# Patient Record
Sex: Male | Born: 1980 | Hispanic: No | Marital: Married | Smoking: Never smoker
Health system: Southern US, Community
[De-identification: ages and names within clinical notes are randomized; demographics above are authoritative.]

## PROBLEM LIST (undated history)

## (undated) DIAGNOSIS — F909 Attention-deficit hyperactivity disorder, unspecified type: Secondary | ICD-10-CM

## (undated) DIAGNOSIS — E785 Hyperlipidemia, unspecified: Secondary | ICD-10-CM

## (undated) HISTORY — PX: ARTHROSCOPIC REPAIR ACL: SUR80

---

## 2017-04-19 ENCOUNTER — Ambulatory Visit
Admission: RE | Admit: 2017-04-19 | Discharge: 2017-04-19 | Disposition: A | Discharge status: Home / Self Care | Payer: PRIVATE HEALTH INSURANCE | Source: Ambulatory Visit | Attending: Family Medicine | Admitting: Family Medicine

## 2017-04-19 ENCOUNTER — Other Ambulatory Visit: Payer: Self-pay | Admitting: Family Medicine

## 2017-04-19 DIAGNOSIS — M5489 Other dorsalgia: Secondary | ICD-10-CM

## 2017-11-05 IMAGING — CR DG LUMBAR SPINE 2-3V
3 series · 3 of 3 positions shown · non-contrast
Comparison: None.

CLINICAL DATA: Bilateral low back pain for 1-2 years which radiates
in the left leg.

EXAM:
LUMBAR SPINE - 2-3 VIEW

[t l-spine a.p.]
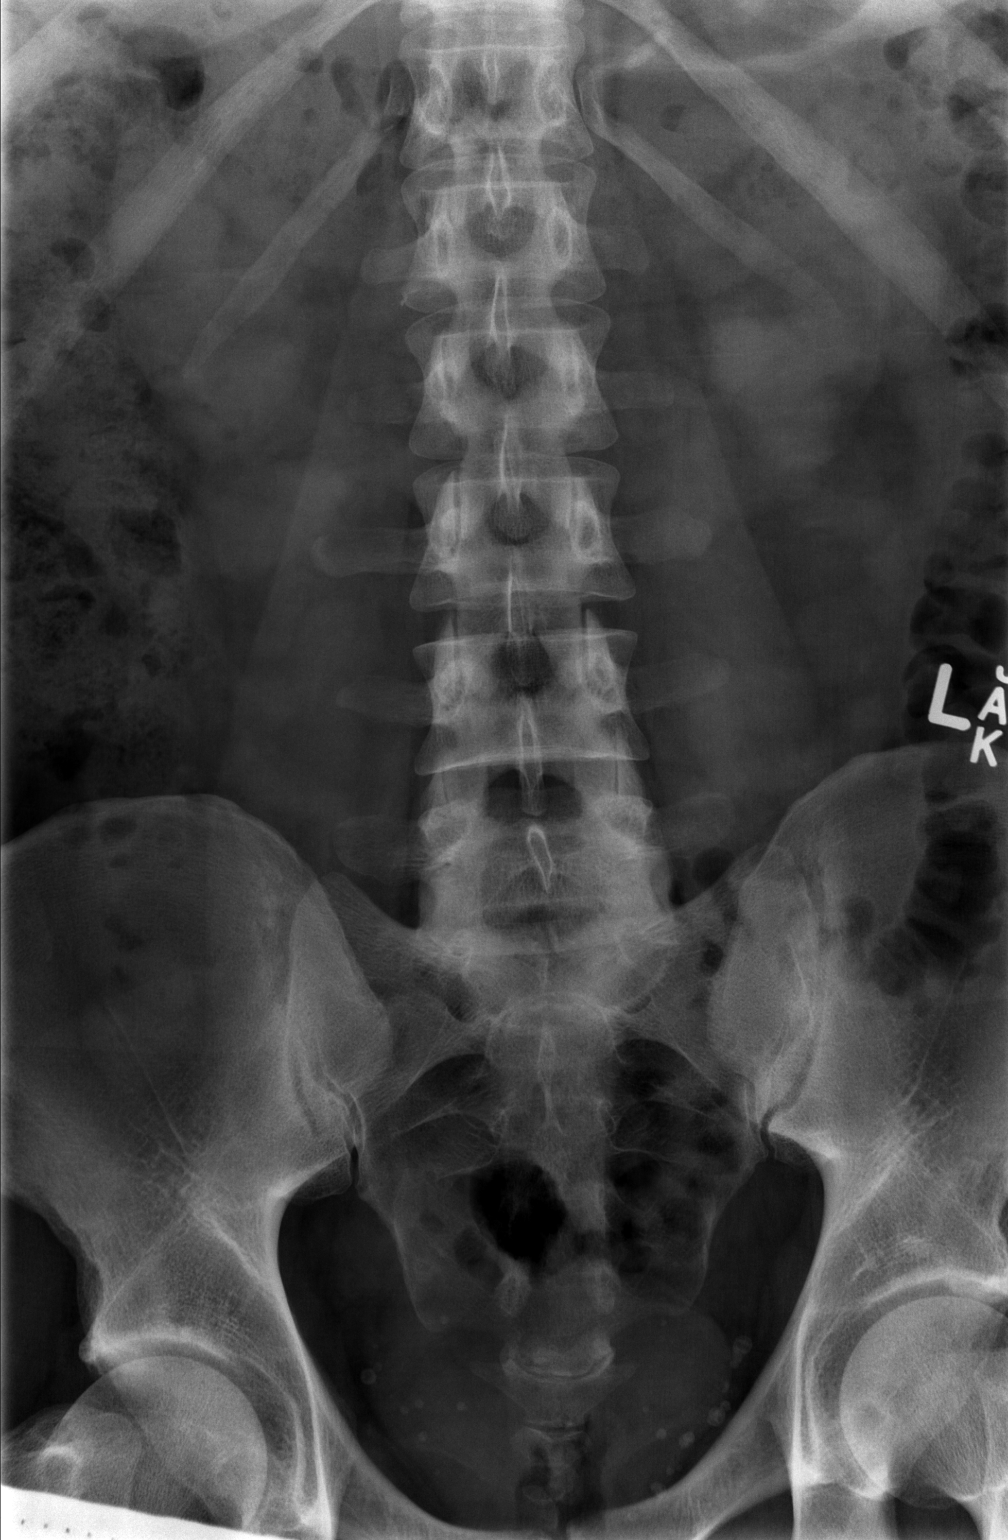

[t l-spine lat]
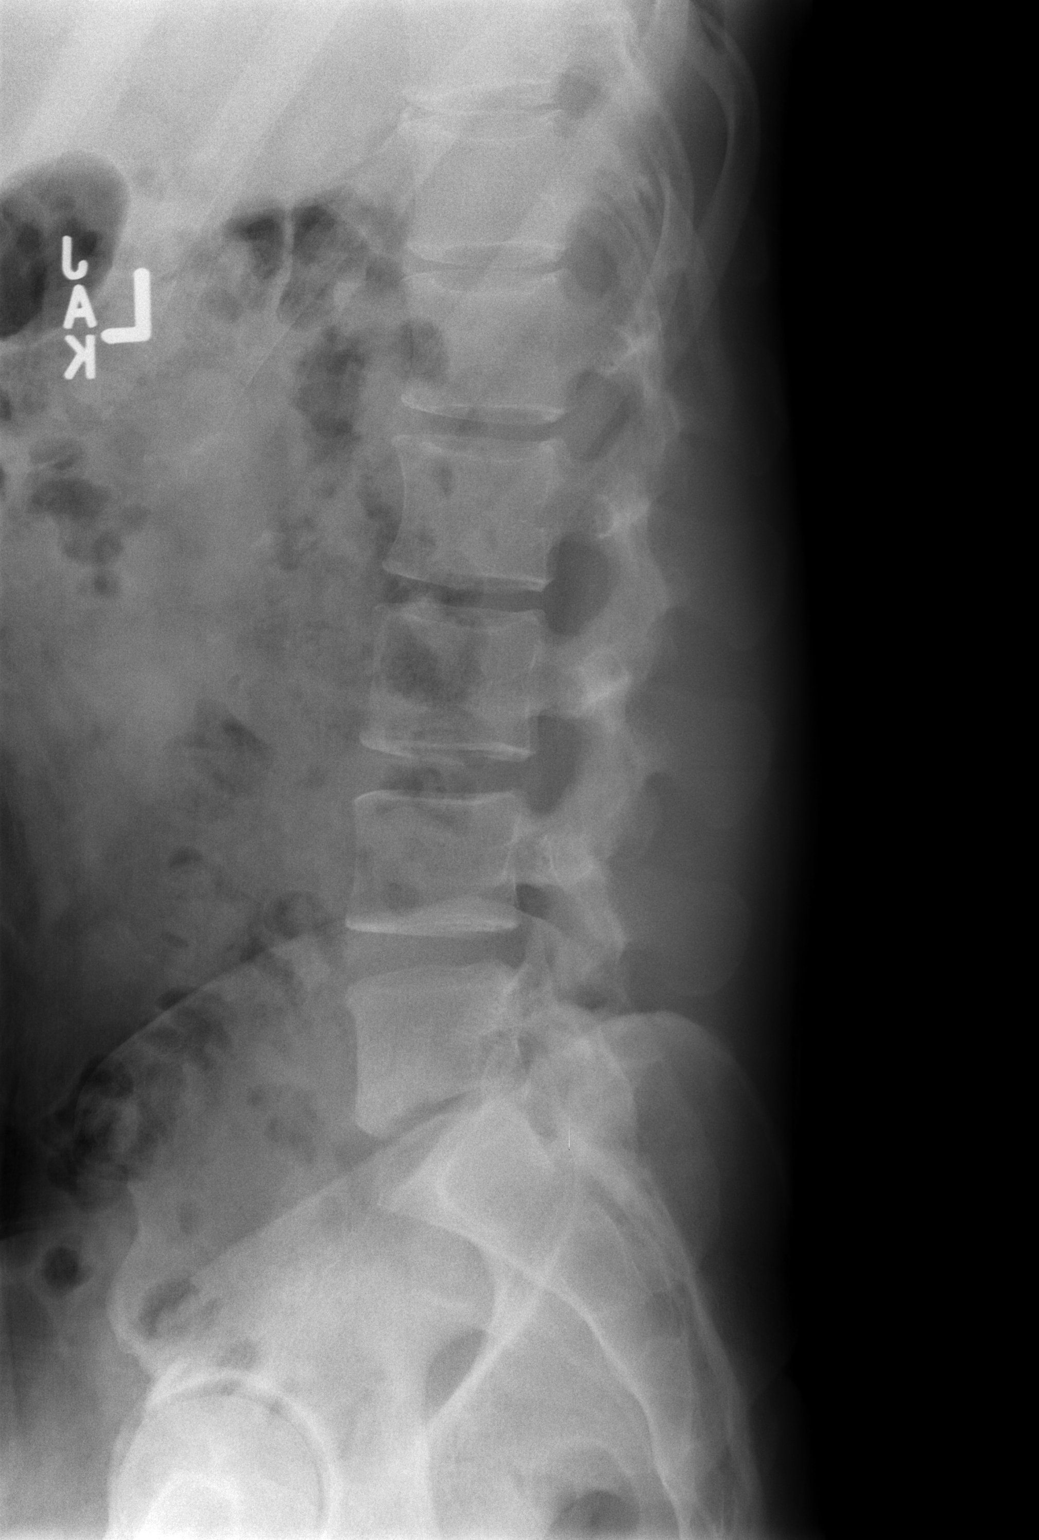

[t l-spine l5-s1 spot]
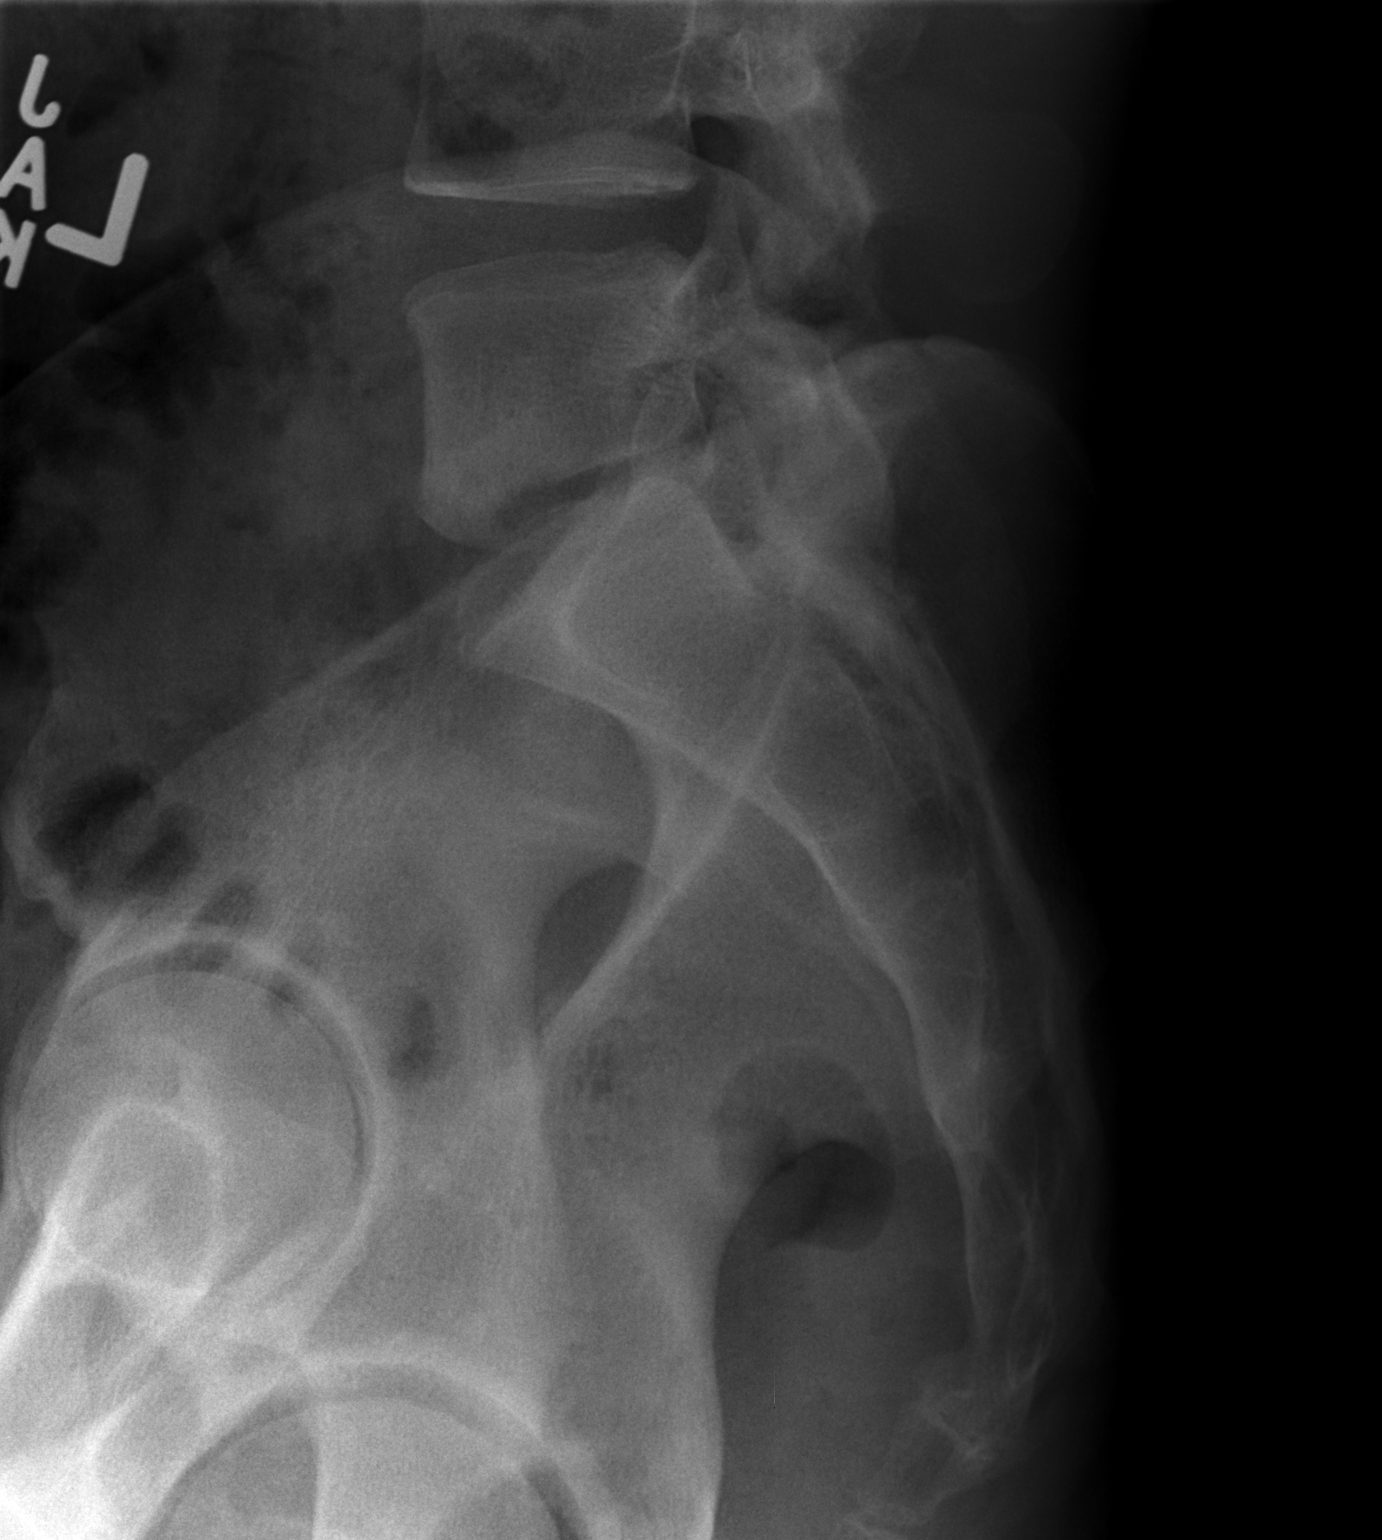

[3 of 3 positions shown; findings below may reference images not displayed]

FINDINGS: There 5 non rib-bearing lumbar type vertebrae. There is
straightening of the normal lumbar lordosis with grade 1
retrolisthesis of L5 on S1. Moderate disc space narrowing is present
at L5-S1. Intervertebral disc space heights are preserved elsewhere.
Lumbar vertebral body heights are preserved, and no fracture is
identified. Numerous small calcifications in the pelvis likely
represent phleboliths.
IMPRESSION: L5-S1 disc degeneration with grade 1 retrolisthesis.

## 2020-04-07 DIAGNOSIS — E785 Hyperlipidemia, unspecified: Secondary | ICD-10-CM | POA: Insufficient documentation

## 2020-06-21 DIAGNOSIS — R5383 Other fatigue: Secondary | ICD-10-CM | POA: Insufficient documentation

## 2021-07-28 DIAGNOSIS — F900 Attention-deficit hyperactivity disorder, predominantly inattentive type: Secondary | ICD-10-CM | POA: Insufficient documentation

## 2022-02-02 ENCOUNTER — Encounter: Payer: Self-pay | Admitting: Primary Care

## 2022-02-02 ENCOUNTER — Ambulatory Visit (INDEPENDENT_AMBULATORY_CARE_PROVIDER_SITE_OTHER): Payer: BC Managed Care – PPO | Admitting: Primary Care

## 2022-02-02 VITALS — BP 118/72 | HR 71 | Temp 98.5°F | Ht 73.0 in | Wt 235.0 lb

## 2022-02-02 DIAGNOSIS — R400 Somnolence: Secondary | ICD-10-CM | POA: Diagnosis not present

## 2022-02-02 DIAGNOSIS — R7989 Other specified abnormal findings of blood chemistry: Secondary | ICD-10-CM | POA: Insufficient documentation

## 2022-02-02 DIAGNOSIS — R0683 Snoring: Secondary | ICD-10-CM | POA: Diagnosis not present

## 2022-02-02 DIAGNOSIS — G4700 Insomnia, unspecified: Secondary | ICD-10-CM | POA: Diagnosis not present

## 2022-02-02 DIAGNOSIS — J310 Chronic rhinitis: Secondary | ICD-10-CM | POA: Diagnosis not present

## 2022-02-02 NOTE — Assessment & Plan Note (Signed)
-   Patient has symptoms of loud snoring, restless sleep, gasping for air while sleeping and daytime fatigue.  Epworth 6. BMI 31. Concern patient could have obstructive sleep apnea, needs home sleep study to evaluate. Discussed risk of untreated sleep apnea including cardiac arrhythmias, pulm HTN, stroke, DM. We briefly reviewed treatment options. Encouraged patient to work on weight loss efforts and focus on side sleeping position/elevate head of bed. Advised against driving if experiencing excessive daytime sleepiness. Follow-up in 4-6 weeks to review sleep study results and discuss treatment options further. ? ?

## 2022-02-02 NOTE — Patient Instructions (Addendum)
Sleep apnea is defined as period of 10 seconds or longer when you stop breathing at night. This can happen multiple times a night. Dx sleep apnea is when this occurs more than 5 times an hour.  ?  ?Mild OSA 5-15 apneic events an hour ?Moderate OSA 15-30 apneic events an hour ?Severe OSA > 30 apneic events an hour ?  ?Untreated sleep apnea puts you at higher risk for cardiac arrhythmias, pulmonary HTN, stroke and diabetes ?  ?Treatment options include weight loss, side sleeping position, oral appliance, CPAP therapy or referral to ENT for possible surgical options  ?  ?Recommendations: ?Focus on side sleeping position ?Work on weight loss efforts  ?Do not drive if experiencing excessive daytime sleepiness of fatigue  ?  ?Orders: ?Home sleep study re: snoring  ?  ?Follow-up: ?Call after home sleep test to set up virtual visit with [facility] NP to review sleep study results and discuss treatment options further ? ?

## 2022-02-02 NOTE — Assessment & Plan Note (Signed)
-   Following with PCP/ currently being worked up  ?

## 2022-02-02 NOTE — Assessment & Plan Note (Signed)
-   Patient has difficulty initiating and staying asleep. Tried and failed melatonin. He is not interested in prescription sleep aid at this time.  Recommend diphenhydramine 25-50mg  at bedtime.  ?

## 2022-02-02 NOTE — Assessment & Plan Note (Signed)
-   Continue prn Zyrtec and flonase ?- Consider ENT eval after sleep study to assess nasal deviation  ?

## 2022-02-02 NOTE — Progress Notes (Signed)
? ?_0  ID: Barbara Cower, male    DOB: Oct 09, 1981, 41 y.o.   MRN: [id number] ? ?Chief Complaint  ?Patient presents with  ? Consult  ?  He has trouble staying and going to sleep, he feels that he snores due to 10 lb weight gain, he feels tired during the day.   ? ? ?Referring provider: ?Lazoff, Shawn P, DO ? ?HPI: ?41 year old male. PMH significant for ADHD, dyslipidemia and fatigue.  ? ?02/02/2022 ?Patient presents today for sleep consult. He has symptoms of snoring, restless sleep and fatigue. He has had symptoms for several years. He is married and has 3 month and 29.40 year old at home. His children sleep well through the night. His wife has told him he gasp for air while sleeping. He has no energy to work out. His testonseter levels have been low. He takes Vyvanse 35m in the morning and prn Adderal 512min the afternoon as needed. He has trouble falling asleep at night at times. He has tried melatonin, higher dose causes some grogginess but low dose does not work. He has not tried diphenhydramine. He does not want to take prescription sleep aid d/t having children at home. He has chronic sinus congestion and will take zyrtec, flonase and sudafed PE as needed. His goal weight is 210lbs. No symptoms of narcolepsy, cataplexy or sleep walking.  ? ?Sleep questionnaire ?Symptoms- Snoring, restless sleep and daytime sleepiness    ?Prior sleep study- None ?Bedtime- 9:30-11:30pm ?Time to fall asleep- 45-6037m ?Nocturnal awakenings- 2-3 times  ?Out of bed/start of day- 5:30-6:30am ?Weight changes- up 10 lbs  ?Do you operate heavy machinery- No ?Do you currently wear CPAP- No ?Do you current wear oxygen- No ?Epworth- 6 ? ?No Known Allergies ? ?Immunization History  ?Administered Date(s) Administered  ? DTP 08/05/1982, 09/22/1982, 12/17/1982, 05/15/1984, 06/08/1987  ? Influenza,inj,Quad PF,6+ Mos 08/24/2021  ? Influenza-Unspecified 09/30/2020  ? MMR 04/09/1984, 04/10/1987, 04/01/1996  ? Moderna Sars-Covid-2 Vaccination  01/06/2020, 02/03/2020  ? OPV 08/05/1982, 09/22/1982, 05/15/1984, 06/08/1987  ? Td 07/29/1998  ? Tdap 05/26/2019  ? ? ?No past medical history on file. ? ?Tobacco History: ?Social History  ? ?Tobacco Use  ?Smoking Status Never  ? Passive exposure: Never  ?Smokeless Tobacco Never  ? ?Counseling given: Not Answered ? ? ?Outpatient Medications Prior to Visit  ?Medication Sig Dispense Refill  ? amphetamine-dextroamphetamine (ADDERALL) 5 MG tablet Take 1 tablet by mouth daily as needed.    ? cholecalciferol (VITAMIN D3) 25 MCG (1000 UNIT) tablet Take 1,000 Units by mouth daily.    ? Diclofenac 35 MG CAPS     ? Multiple Vitamins-Minerals (MULTIVITAMIN WITH MINERALS) tablet Take 1 tablet by mouth daily.    ? pantoprazole (PROTONIX) 40 MG tablet Take 40 mg by mouth daily.    ? Probiotic Product (PROBIOTIC BLEND PO) Take 1 capsule by mouth.    ? VYVANSE 60 MG capsule Take 60 mg by mouth every morning.    ? ?No facility-administered medications prior to visit.  ? ?Review of Systems ? ?Review of Systems  ?Constitutional:  Positive for fatigue.  ?HENT:  Positive for congestion and rhinorrhea.   ?Respiratory: Negative.    ?Cardiovascular: Negative.   ?Psychiatric/Behavioral:  Positive for sleep disturbance.   ? ? ?Physical Exam ? ?BP 118/72 (BP Location: Left Arm, Patient Position: Sitting, Cuff Size: Normal)   Pulse 71   Temp 98.5 ?F (36.9 ?C) (Oral)   Ht _1  (1.854 m)   Wt 235 lb (106.6 kg)  SpO2 96%   BMI 31.00 kg/m?  ?Physical Exam ?Constitutional:   ?   Appearance: Normal appearance.  ?HENT:  ?   Head: Normocephalic and atraumatic.  ?   Nose:  ?   Comments: Nasal deviation to the right  ?   Mouth/Throat:  ?   Mouth: Mucous membranes are moist.  ?   Pharynx: Oropharynx is clear.  ?Cardiovascular:  ?   Rate and Rhythm: Normal rate and regular rhythm.  ?Pulmonary:  ?   Effort: Pulmonary effort is normal.  ?   Breath sounds: Normal breath sounds.  ?Musculoskeletal:     ?   General: Normal range of motion.  ?   Cervical  back: Normal range of motion and neck supple.  ?Skin: ?   General: Skin is warm and dry.  ?Neurological:  ?   General: No focal deficit present.  ?   Mental Status: He is alert and oriented to person, place, and time. Mental status is at baseline.  ?Psychiatric:     ?   Mood and Affect: Mood normal.     ?   Behavior: Behavior normal.     ?   Thought Content: Thought content normal.     ?   Judgment: Judgment normal.  ?  ? ?Lab Results: ? ?CBC ?No results found for: WBC, RBC, HGB, HCT, PLT, MCV, MCH, MCHC, RDW, LYMPHSABS, MONOABS, EOSABS, BASOSABS ? ?BMET ?No results found for: NA, K, CL, CO2, GLUCOSE, BUN, CREATININE, CALCIUM, GFRNONAA, GFRAA ? ?BNP ?No results found for: BNP ? ?ProBNP ?No results found for: PROBNP ? ?Imaging: ?No results found. ? ? ?Assessment & Plan:  ? ?Loud snoring ?- Patient has symptoms of loud snoring, restless sleep, gasping for air while sleeping and daytime fatigue.  Epworth 6. BMI 31. Concern patient could have obstructive sleep apnea, needs home sleep study to evaluate. Discussed risk of untreated sleep apnea including cardiac arrhythmias, pulm HTN, stroke, DM. We briefly reviewed treatment options. Encouraged patient to work on weight loss efforts and focus on side sleeping position/elevate head of bed. Advised against driving if experiencing excessive daytime sleepiness. Follow-up in 4-6 weeks to review sleep study results and discuss treatment options further. ? ? ?Chronic rhinitis ?- Continue prn Zyrtec and flonase ?- Consider ENT eval after sleep study to assess nasal deviation  ? ?Insomnia ?- Patient has difficulty initiating and staying asleep. Tried and failed melatonin. He is not interested in prescription sleep aid at this time.  Recommend diphenhydramine 25-7m at bedtime.  ? ?Low testosterone ?- Following with PCP/ currently being worked up  ? ? ?EMartyn Ehrich NP ?02/02/2022 ? ?

## 2022-03-16 ENCOUNTER — Ambulatory Visit: Payer: BC Managed Care – PPO

## 2022-03-16 DIAGNOSIS — R0683 Snoring: Secondary | ICD-10-CM

## 2022-03-16 DIAGNOSIS — R400 Somnolence: Secondary | ICD-10-CM

## 2022-03-21 ENCOUNTER — Ambulatory Visit: Payer: BC Managed Care – PPO

## 2022-03-21 DIAGNOSIS — R0683 Snoring: Secondary | ICD-10-CM

## 2022-03-30 DIAGNOSIS — R0683 Snoring: Secondary | ICD-10-CM | POA: Diagnosis not present

## 2022-04-05 ENCOUNTER — Telehealth: Payer: Self-pay | Admitting: Primary Care

## 2022-04-05 NOTE — Telephone Encounter (Signed)
Donald Nixon patient is wanting the results of sleep study. He refused office visit until he knows the results. Sleep study was done last month. Please advise

## 2022-04-06 NOTE — Telephone Encounter (Signed)
HST on 03/22/22 >> He had a total of 6 apneas and 1 hypopnea. His AHI score was 1.8/hr. SpO2 low 92% with average 96%. Percentage of snoring was 55%/ This is a normal study and does not meet diagnostic criteria of sleep apnea. He does not need CPAP. We can refer to orthodontics for oral appliance to treat primary snoring.

## 2022-04-06 NOTE — Telephone Encounter (Signed)
Attempted to call pt but unable to reach. Left message for him to return call. Routing back to triage as it originated in triage.

## 2022-04-06 NOTE — Telephone Encounter (Signed)
Called and spoke to pt. Informed him of the results and recs per [facility]. Pt verbalized understanding and states he would like to wait on the orthodontics referral and would like to focus on weight loss. Pt states he will call back if he wants the referral. Will forward to [facility] as FYI.

## 2022-06-10 ENCOUNTER — Other Ambulatory Visit (HOSPITAL_BASED_OUTPATIENT_CLINIC_OR_DEPARTMENT_OTHER): Payer: Self-pay

## 2022-06-10 MED ORDER — VYVANSE 60 MG PO CAPS
ORAL_CAPSULE | ORAL | 0 refills | Status: DC
  Filled 2022-06-10: qty 30, 30d supply, fill #0

## 2022-07-07 ENCOUNTER — Other Ambulatory Visit (HOSPITAL_BASED_OUTPATIENT_CLINIC_OR_DEPARTMENT_OTHER): Payer: Self-pay

## 2022-07-07 MED ORDER — LISDEXAMFETAMINE DIMESYLATE 60 MG PO CAPS
60.0000 mg | ORAL_CAPSULE | Freq: Every morning | ORAL | 0 refills | Status: DC
  Filled 2022-07-07: qty 30, 30d supply, fill #0

## 2022-07-13 ENCOUNTER — Other Ambulatory Visit (HOSPITAL_BASED_OUTPATIENT_CLINIC_OR_DEPARTMENT_OTHER): Payer: Self-pay

## 2022-07-13 ENCOUNTER — Ambulatory Visit: Payer: BC Managed Care – PPO | Admitting: Primary Care

## 2022-07-13 ENCOUNTER — Encounter: Payer: Self-pay | Admitting: Primary Care

## 2022-07-13 DIAGNOSIS — R5383 Other fatigue: Secondary | ICD-10-CM

## 2022-07-13 DIAGNOSIS — R0683 Snoring: Secondary | ICD-10-CM | POA: Diagnosis not present

## 2022-07-13 DIAGNOSIS — G4700 Insomnia, unspecified: Secondary | ICD-10-CM | POA: Diagnosis not present

## 2022-07-13 DIAGNOSIS — R7989 Other specified abnormal findings of blood chemistry: Secondary | ICD-10-CM

## 2022-07-13 MED ORDER — HYDROXYZINE HCL 25 MG PO TABS
25.0000 mg | ORAL_TABLET | Freq: Every evening | ORAL | 0 refills | Status: AC
  Filled 2022-07-13: qty 45, 22d supply, fill #0

## 2022-07-13 NOTE — Progress Notes (Signed)
_0  ID: Donald Nixon, male    DOB: 06/14/1981, 41 y.o.   MRN: [id number]  Chief Complaint  Patient presents with   Follow-up    Referring provider: Percell Belt, DO  HPI: 41 year old male. PMH significant for ADHD, dyslipidemia and fatigue.   Previous [facility] pulmonary encounter:  02/02/2022 Patient presents today for sleep consult. He has symptoms of snoring, restless sleep and fatigue. He has had symptoms for several years. He is married and has 70 month and 70.40 year old at home. His children sleep well through the night. His wife has told him he gasp for air while sleeping. He has no energy to work out. His testosterone levels have been low. He takes Vyvanse 13m in the morning and prn Adderall 597min the afternoon as needed. He has trouble falling asleep at night at times. He has tried melatonin, higher dose causes some grogginess but low dose does not work. He has not tried diphenhydramine. He does not want to take prescription sleep aid d/t having children at home. He has chronic sinus congestion and will take zyrtec, flonase and sudafed PE as needed. His goal weight is 210lbs. No symptoms of narcolepsy, cataplexy or sleep walking.   Sleep questionnaire Symptoms- Snoring, restless sleep and daytime sleepiness    Prior sleep study- None Bedtime- 9:30-11:30pm Time to fall asleep- 45-6033m Nocturnal awakenings- 2-3 times  Out of bed/start of day- 5:30-6:30am Weight changes- up 10 lbs  Do you operate heavy machinery- No Do you currently wear CPAP- No Do you current wear oxygen- No Epworth- 6   07/13/2022- Interim hx  Patient reports that he continues to have symptoms concerning for sleep apnea. His wife has noticed more snoring and witnessed several apneas within the first hour of him falling asleep. She has also seen him gasping for air and choke while sleeping. He had a home sleep test on 03/22/22 that showed no evidence of OSA, he had a total of 6 apneas and 1 hypopnea. His  AHI score was 1.8/hr. SpO2 low 92% with average 96%. Percentage of snoring was 55%. He continues to struggle with severe daytime sleepiness and fatigue. He depends on Vyvanse to make it through the day. He reports weight gain. He has low testosterone and vit D levels.  No Known Allergies  Immunization History  Administered Date(s) Administered   DTP 08/05/1982, 09/22/1982, 12/17/1982, 05/15/1984, 06/08/1987   Influenza,inj,Quad PF,6+ Mos 08/24/2021, 07/06/2022   Influenza-Unspecified 09/30/2020, 07/06/2022   MMR 04/09/1984, 04/10/1987, 04/01/1996   Moderna Sars-Covid-2 Vaccination 01/06/2020, 02/03/2020   OPV 08/05/1982, 09/22/1982, 05/15/1984, 06/08/1987   Td 07/29/1998   Tdap 05/26/2019    No past medical history on file.  Tobacco History: Social History   Tobacco Use  Smoking Status Never   Passive exposure: Never  Smokeless Tobacco Never   Counseling given: Not Answered   Outpatient Medications Prior to Visit  Medication Sig Dispense Refill   cholecalciferol (VITAMIN D3) 25 MCG (1000 UNIT) tablet Take 1,000 Units by mouth daily.     Diclofenac 35 MG CAPS      lisdexamfetamine (VYVANSE) 60 MG capsule Take 1 capsule (60 mg total) by mouth every morning. 30 capsule 0   Multiple Vitamins-Minerals (MULTIVITAMIN WITH MINERALS) tablet Take 1 tablet by mouth daily.     pantoprazole (PROTONIX) 40 MG tablet Take 40 mg by mouth daily.     testosterone cypionate (DEPOTESTOSTERONE CYPIONATE) 200 MG/ML injection Inject into the muscle.     diclofenac (VOLTAREN) 75 MG  EC tablet Take 75 mg by mouth 2 (two) times daily as needed.     VYVANSE 60 MG capsule Take 60 mg by mouth every morning.     No facility-administered medications prior to visit.    Review of Systems  Review of Systems  Constitutional:  Positive for fatigue.  Respiratory:  Positive for apnea and choking.   Cardiovascular: Negative.      Physical Exam  BP 116/82 (BP Location: Left Arm, Patient Position:  Sitting, Cuff Size: Large)   Pulse 87   Temp 98.3 F (36.8 C) (Oral)   Ht 6' (1.829 m)   Wt 240 lb (108.9 kg)   SpO2 97%   BMI 32.55 kg/m  Physical Exam Constitutional:      Appearance: Normal appearance.  HENT:     Head: Normocephalic and atraumatic.     Nose:     Comments: Deviated nasal septum    Mouth/Throat:     Mouth: Mucous membranes are moist.     Tonsils: 2+ on the right. 2+ on the left.  Cardiovascular:     Rate and Rhythm: Normal rate and regular rhythm.     Pulses: Normal pulses.     Heart sounds: Normal heart sounds.  Musculoskeletal:        General: Normal range of motion.     Cervical back: Normal range of motion and neck supple.  Skin:    General: Skin is warm and dry.  Neurological:     Mental Status: He is alert and oriented to person, place, and time. Mental status is at baseline.  Psychiatric:        Mood and Affect: Mood normal.        Thought Content: Thought content normal.        Judgment: Judgment normal.      Lab Results:  CBC No results found for: "WBC", "RBC", "HGB", "HCT", "PLT", "MCV", "MCH", "MCHC", "RDW", "LYMPHSABS", "MONOABS", "EOSABS", "BASOSABS"  BMET No results found for: "NA", "K", "CL", "CO2", "GLUCOSE", "BUN", "CREATININE", "CALCIUM", "GFRNONAA", "GFRAA"  BNP No results found for: "BNP"  ProBNP No results found for: "PROBNP"  Imaging: No results found.   Assessment & Plan:   Loud snoring - Patient continues to have symptoms concerning for sleep apnea. He reports loud snoring, witnessed apnea, waking up gasping for air/choking at night and extreme daytime sleepiness. He had a home sleep study that was negative for sleep apnea. His symptoms remain highly concerning for OSA, he needs an in-lab polysomnography. If negative he may benefit from oral device to help with snoring and may also want to see ENT for surgical evaluation.   Fatigue - Significantly impairing his quality of life. He takes Vyanse for ADHD symptoms and  still struggles with fatigue. No symptoms classically associated with narcolepsy.   Low testosterone - Receiving Testosterone replacement, following with PCP  Insomnia - We will try Atarax 25-76m for insomnia. Also recommend CBT for insomnia symptoms, he was given contact information for the [facility] [place] NP 07/14/2022

## 2022-07-13 NOTE — Patient Instructions (Addendum)
Sleep study in May showed very occasional apneas and hypopneas that were within normal limits.  You had a total of 6 apneic events for an index score of 1.5 an hour.  This is nondiagnostic for obstructive sleep apnea.    You did have snoring symptoms- treatment options include weight loss, positional sleep (recommend you avoid sleeping directly on your back or elevate head 30 degrees with wedge pillow), oral appliance or referral to ENT for surgical evaluation  We will repeat sleep study in lab since suspicion for sleep apnea remains high and home results were negative   Try Atarax for sleep - start with 1 tablet at bedtime for insomnia, can increase to 2 tablets if not effective   Cognitive behavioral therapy can be helpful for insomnia- you can call [facility] to set up an appointment [phone]  Follow-up: After sleep study

## 2022-07-13 NOTE — Telephone Encounter (Signed)
Please advise 

## 2022-07-14 NOTE — Assessment & Plan Note (Addendum)
-   Significantly impairing his quality of life. He takes Vyanse for ADHD symptoms and still struggles with fatigue. No symptoms classically associated with narcolepsy.

## 2022-07-14 NOTE — Assessment & Plan Note (Signed)
-   Patient continues to have symptoms concerning for sleep apnea. He reports loud snoring, witnessed apnea, waking up gasping for air/choking at night and extreme daytime sleepiness. He had a home sleep study that was negative for sleep apnea. His symptoms remain highly concerning for OSA, he needs an in-lab polysomnography. If negative he may benefit from oral device to help with snoring and may also want to see ENT for surgical evaluation.

## 2022-07-14 NOTE — Assessment & Plan Note (Signed)
-   We will try Atarax 25-50mg  for insomnia. Also recommend CBT for insomnia symptoms, he was given contact information for the [facility] [phone]

## 2022-07-14 NOTE — Assessment & Plan Note (Signed)
-   Receiving Testosterone replacement, following with PCP

## 2022-08-09 ENCOUNTER — Other Ambulatory Visit (HOSPITAL_BASED_OUTPATIENT_CLINIC_OR_DEPARTMENT_OTHER): Payer: Self-pay

## 2022-08-10 ENCOUNTER — Other Ambulatory Visit (HOSPITAL_BASED_OUTPATIENT_CLINIC_OR_DEPARTMENT_OTHER): Payer: Self-pay

## 2022-08-10 MED ORDER — LISDEXAMFETAMINE DIMESYLATE 60 MG PO CAPS
ORAL_CAPSULE | ORAL | 0 refills | Status: DC
  Filled 2022-08-10: qty 30, 30d supply, fill #0

## 2022-09-09 ENCOUNTER — Other Ambulatory Visit (HOSPITAL_BASED_OUTPATIENT_CLINIC_OR_DEPARTMENT_OTHER): Payer: Self-pay

## 2022-09-09 MED ORDER — LISDEXAMFETAMINE DIMESYLATE 60 MG PO CAPS
60.0000 mg | ORAL_CAPSULE | Freq: Every morning | ORAL | 0 refills | Status: DC
  Filled 2022-09-09: qty 30, 30d supply, fill #0

## 2022-10-10 ENCOUNTER — Other Ambulatory Visit (HOSPITAL_BASED_OUTPATIENT_CLINIC_OR_DEPARTMENT_OTHER): Payer: Self-pay

## 2022-10-10 MED ORDER — LISDEXAMFETAMINE DIMESYLATE 60 MG PO CAPS
60.0000 mg | ORAL_CAPSULE | Freq: Every morning | ORAL | 0 refills | Status: DC
  Filled 2022-10-10: qty 30, 30d supply, fill #0

## 2022-12-05 ENCOUNTER — Other Ambulatory Visit (HOSPITAL_BASED_OUTPATIENT_CLINIC_OR_DEPARTMENT_OTHER): Payer: Self-pay

## 2022-12-05 MED ORDER — AMPHETAMINE-DEXTROAMPHET ER 25 MG PO CP24
25.0000 mg | ORAL_CAPSULE | Freq: Every morning | ORAL | 0 refills | Status: DC
  Filled 2022-12-05: qty 30, 30d supply, fill #0

## 2022-12-05 MED ORDER — TESTOSTERONE CYPIONATE 200 MG/ML IM SOLN
INTRAMUSCULAR | 0 refills | Status: AC
  Filled 2022-12-06: qty 4, 28d supply, fill #0

## 2022-12-05 MED ORDER — "BD LUER-LOK SYRINGE 22G X 1"" 3 ML MISC"
0 refills | Status: AC
  Filled 2022-12-06: qty 10, 70d supply, fill #0

## 2022-12-06 ENCOUNTER — Other Ambulatory Visit (HOSPITAL_BASED_OUTPATIENT_CLINIC_OR_DEPARTMENT_OTHER): Payer: Self-pay

## 2022-12-06 ENCOUNTER — Other Ambulatory Visit: Payer: Self-pay

## 2022-12-27 ENCOUNTER — Other Ambulatory Visit: Payer: Self-pay | Admitting: Otolaryngology

## 2023-01-02 ENCOUNTER — Ambulatory Visit (INDEPENDENT_AMBULATORY_CARE_PROVIDER_SITE_OTHER): Payer: BC Managed Care – PPO

## 2023-01-02 ENCOUNTER — Other Ambulatory Visit: Payer: Self-pay

## 2023-01-02 ENCOUNTER — Encounter (HOSPITAL_BASED_OUTPATIENT_CLINIC_OR_DEPARTMENT_OTHER): Payer: Self-pay | Admitting: Otolaryngology

## 2023-01-02 ENCOUNTER — Ambulatory Visit: Payer: BC Managed Care – PPO | Admitting: Sports Medicine

## 2023-01-02 ENCOUNTER — Encounter: Payer: Self-pay | Admitting: Sports Medicine

## 2023-01-02 DIAGNOSIS — G8929 Other chronic pain: Secondary | ICD-10-CM | POA: Diagnosis not present

## 2023-01-02 DIAGNOSIS — S43005S Unspecified dislocation of left shoulder joint, sequela: Secondary | ICD-10-CM

## 2023-01-02 DIAGNOSIS — M25512 Pain in left shoulder: Secondary | ICD-10-CM | POA: Diagnosis not present

## 2023-01-02 DIAGNOSIS — M542 Cervicalgia: Secondary | ICD-10-CM | POA: Diagnosis not present

## 2023-01-02 NOTE — Progress Notes (Signed)
10+ years of pain; recent flare-up within the last week or so States like he is losing strength/grip in that hand/arm Limited ROM due to pain/weakness  Takes tylenol for pain; has been doing Diclofenac but has an upcoming surgery so he had to discontinue for now  He states the pain radiates into the upper arm/ bicep area only  No history of neck surgery or recent injury

## 2023-01-02 NOTE — Progress Notes (Signed)
Donald Nixon - 42 y.o. male MRN [id number]  Date of birth: 1981/09/27  Office Visit Note: Visit Date: 01/02/2023 PCP: Donald Belt, DO Referred by: Donald Belt, DO  Subjective: Chief Complaint  Patient presents with   Neck - Pain   Left Shoulder - Pain   HPI: Donald Nixon is a pleasant 42 y.o. male who presents today for acute on chronic left shoulder pain.  He has had pain in the left shoulder for about 15 years.  He states around this time he had a rather significant shoulder injury when snowboarding when he fell onto that left shoulder.  He did not seek evaluation at this time but feels like his shoulder has not been right since.  His pain waxes and wanes.  He recently over the last 1 to 2 weeks he has had a flare of his pain. He points to pain over the superior anterior aspect of the shoulder.  He noticed that with bending and overhead activities such as weightlifting.  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  At times will note some popping or crepitus of the shoulder.  When he was younger playing sports he reports maybe having a shoulder dislocation or subluxation which was reduced by his athletic trainer at that time, this was the left shoulder and did not seek evaluation after that.  Pertinent ROS were reviewed with the patient and found to be negative unless otherwise specified above in HPI.   Assessment & Plan: Visit Diagnoses:  1. Chronic left shoulder pain   2. Neck pain   3. Shoulder separation, left, sequela    Plan: Discussed with Donald Nixon that I believe his shoulder pain was from his previous injury snowboarding about 15 years ago.  His x-rays suggest he had a AC joint separation, possibly grade 3, that was never evaluated nor treated.  We discussed all treatment options such as physical therapy to work on stability of this joint and shoulder, diagnostic and therapeutic ultrasound-guided AC joint injection, or medications, further imaging such as MRI.  He decided to  start with physical therapy.  Would like to see how he responds to this over the coming 6 weeks.  If for some reason his pain is more bothersome starting this, he may present at any point for ultrasound-guided [facility] joint injection. F/u in 6 weeks.   Follow-up: Return in about 6 weeks (around 02/13/2023) for for left shoulder.   Meds & Orders: No orders of the defined types were placed in this encounter.   Orders Placed This Encounter  Procedures   XR Shoulder Left   XR Cervical Spine 2 or 3 views   Ambulatory referral to Physical Therapy     Procedures: No procedures performed      Clinical History: No specialty comments available.  He reports that he has never smoked. He has never been exposed to tobacco smoke. He has never used smokeless tobacco. No results for input(s): "HGBA1C", "LABURIC" in the last 8760 hours.  Objective:    Physical Exam  Gen: Well-appearing, in no acute distress; non-toxic CV: Regular Rate. Well-perfused. Warm.  Resp: Breathing unlabored on room air; no wheezing. Psych: Fluid speech in conversation; appropriate affect; normal thought process Neuro: Sensation intact throughout. No gross coordination deficits.   Ortho Exam - Left shoulder: Inspection of the left shoulder shows a mildly high riding clavicle especially with bilateral shoulder shrug compared to the contralateral shoulder.  There is positive TTP over the [facility] joint.  No  overlying redness, swelling or effusion of the shoulder.  There is some pain and limitation with scarf testing as well as internal rotation, otherwise full strength.  There is no gross weakness with rotator cuff testing. + Scarf test, crossarm adduction.  Imaging: XR Shoulder Left  Result Date: 01/02/2023 4 views of the left shoulder including AP, Grashey, scapular Y and axial view were ordered and reviewed by myself.  The humeral head is well located.  There is no significant glenohumeral joint arthritic change.  There is some  separation of the [facility] joint with some superior migration of the distal clavicle.  There is some cortical irregularity within the [facility] joint that is well-circumscribed.  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  XR Cervical Spine 2 or 3 views  Result Date: 01/02/2023 2 views of the cervical spine including AP and lateral film were ordered and reviewed by myself.  There is some anterior spurring most notably at C6 and C7.  There is relatively well-preserved spacing between the intervertebral discs.  No acute fracture or bony abnormality.  There is mild loss of the normal cervical lordosis.     Past Medical/Family/Surgical/Social History: Medications & Allergies reviewed per EMR, new medications updated. Patient Active Problem List   Diagnosis Date Noted   Loud snoring 02/02/2022   Chronic rhinitis 02/02/2022   Insomnia 02/02/2022   Low testosterone 02/02/2022   Attention deficit hyperactivity disorder (ADHD), predominantly inattentive type 07/28/2021   Fatigue 06/21/2020   Dyslipidemia 04/07/2020   Past Medical History:  Diagnosis Date   ADHD    Dyslipidemia    Family History  Problem Relation Age of Onset   Asthma Neg Hx    Neurofibromatosis Neg Hx    Tuberous sclerosis Neg Hx    History reviewed. No pertinent surgical history. Social History   Occupational History   Not on file  Tobacco Use   Smoking status: Never    Passive exposure: Never   Smokeless tobacco: Never  Vaping Use   Vaping Use: Never used  Substance and Sexual Activity   Alcohol use: Not Currently    Comment: socially   Drug use: Never   Sexual activity: Not on file

## 2023-01-05 ENCOUNTER — Ambulatory Visit (HOSPITAL_BASED_OUTPATIENT_CLINIC_OR_DEPARTMENT_OTHER): Payer: BC Managed Care – PPO | Admitting: Orthopaedic Surgery

## 2023-01-08 ENCOUNTER — Encounter (HOSPITAL_BASED_OUTPATIENT_CLINIC_OR_DEPARTMENT_OTHER): Payer: Self-pay | Admitting: Otolaryngology

## 2023-01-08 NOTE — Anesthesia Preprocedure Evaluation (Addendum)
Anesthesia Evaluation  Patient identified by MRN, date of birth, ID band Patient awake    Reviewed: Allergy & Precautions, NPO status , Patient's Chart, lab work & pertinent test results  Airway Mallampati: II       Dental no notable dental hx. (+) Teeth Intact, Caps, Dental Advisory Given   Pulmonary  Snores Deviated nasal septum Bilateral turbinate hypertophy   Pulmonary exam normal breath sounds clear to auscultation       Cardiovascular negative cardio ROS Normal cardiovascular exam Rhythm:Regular Rate:Normal     Neuro/Psych  PSYCHIATRIC DISORDERS      ADHDnegative neurological ROS     GI/Hepatic negative GI ROS, Neg liver ROS,,,  Endo/Other  Hyperlipidemia Obesity Low testosterone  Renal/GU negative Renal ROS     Musculoskeletal negative musculoskeletal ROS (+)    Abdominal  (+) + obese  Peds  Hematology negative hematology ROS (+)   Anesthesia Other Findings   Reproductive/Obstetrics                             Anesthesia Physical Anesthesia Plan  ASA: 2  Anesthesia Plan: General   Post-op Pain Management: Dilaudid IV, Minimal or no pain anticipated and Precedex   Induction: Intravenous  PONV Risk Score and Plan: 4 or greater and Treatment may vary due to age or medical condition, Midazolam, Ondansetron and Dexamethasone  Airway Management Planned: Oral ETT  Additional Equipment:   Intra-op Plan:   Post-operative Plan: Extubation in OR  Informed Consent: I have reviewed the patients History and Physical, chart, labs and discussed the procedure including the risks, benefits and alternatives for the proposed anesthesia with the patient or authorized representative who has indicated his/her understanding and acceptance.     Dental advisory given  Plan Discussed with: CRNA and Anesthesiologist  Anesthesia Plan Comments:         Anesthesia Quick  Evaluation

## 2023-01-09 ENCOUNTER — Other Ambulatory Visit: Payer: Self-pay

## 2023-01-09 ENCOUNTER — Other Ambulatory Visit (HOSPITAL_BASED_OUTPATIENT_CLINIC_OR_DEPARTMENT_OTHER): Payer: Self-pay

## 2023-01-09 ENCOUNTER — Ambulatory Visit (HOSPITAL_BASED_OUTPATIENT_CLINIC_OR_DEPARTMENT_OTHER)
Admission: RE | Admit: 2023-01-09 | Discharge: 2023-01-09 | Disposition: A | Discharge status: Home / Self Care | Payer: BC Managed Care – PPO | Attending: Otolaryngology | Admitting: Otolaryngology

## 2023-01-09 ENCOUNTER — Encounter (HOSPITAL_BASED_OUTPATIENT_CLINIC_OR_DEPARTMENT_OTHER)
Admission: RE | Disposition: A | Discharge status: Home / Self Care | Payer: Self-pay | Source: Home / Self Care | Attending: Otolaryngology

## 2023-01-09 ENCOUNTER — Ambulatory Visit (HOSPITAL_BASED_OUTPATIENT_CLINIC_OR_DEPARTMENT_OTHER): Payer: BC Managed Care – PPO | Admitting: Certified Registered"

## 2023-01-09 ENCOUNTER — Encounter (HOSPITAL_BASED_OUTPATIENT_CLINIC_OR_DEPARTMENT_OTHER): Payer: Self-pay | Admitting: Otolaryngology

## 2023-01-09 DIAGNOSIS — J3489 Other specified disorders of nose and nasal sinuses: Secondary | ICD-10-CM | POA: Insufficient documentation

## 2023-01-09 DIAGNOSIS — E669 Obesity, unspecified: Secondary | ICD-10-CM | POA: Diagnosis not present

## 2023-01-09 DIAGNOSIS — Z6832 Body mass index (BMI) 32.0-32.9, adult: Secondary | ICD-10-CM | POA: Insufficient documentation

## 2023-01-09 DIAGNOSIS — J343 Hypertrophy of nasal turbinates: Secondary | ICD-10-CM | POA: Insufficient documentation

## 2023-01-09 DIAGNOSIS — F909 Attention-deficit hyperactivity disorder, unspecified type: Secondary | ICD-10-CM | POA: Insufficient documentation

## 2023-01-09 DIAGNOSIS — E785 Hyperlipidemia, unspecified: Secondary | ICD-10-CM | POA: Diagnosis not present

## 2023-01-09 DIAGNOSIS — K219 Gastro-esophageal reflux disease without esophagitis: Secondary | ICD-10-CM | POA: Insufficient documentation

## 2023-01-09 DIAGNOSIS — J342 Deviated nasal septum: Secondary | ICD-10-CM | POA: Diagnosis not present

## 2023-01-09 HISTORY — PX: NASAL SEPTOPLASTY W/ TURBINOPLASTY: SHX2070

## 2023-01-09 HISTORY — DX: Attention-deficit hyperactivity disorder, unspecified type: F90.9

## 2023-01-09 HISTORY — DX: Hyperlipidemia, unspecified: E78.5

## 2023-01-09 SURGERY — SEPTOPLASTY, NOSE, WITH NASAL TURBINATE REDUCTION
Anesthesia: General | Site: Nose | Laterality: Bilateral

## 2023-01-09 MED ORDER — PHENYLEPHRINE 80 MCG/ML (10ML) SYRINGE FOR IV PUSH (FOR BLOOD PRESSURE SUPPORT)
PREFILLED_SYRINGE | INTRAVENOUS | Status: AC
  Filled 2023-01-09: qty 10

## 2023-01-09 MED ORDER — LIDOCAINE-EPINEPHRINE 1 %-1:100000 IJ SOLN
INTRAMUSCULAR | Status: AC
  Filled 2023-01-09: qty 1

## 2023-01-09 MED ORDER — PHENYLEPHRINE HCL (PRESSORS) 10 MG/ML IV SOLN
INTRAVENOUS | Status: DC | PRN
  Administered 2023-01-09: 120 ug via INTRAVENOUS
  Administered 2023-01-09: 180 ug via INTRAVENOUS
  Administered 2023-01-09: 120 ug via INTRAVENOUS
  Administered 2023-01-09: 80 ug via INTRAVENOUS

## 2023-01-09 MED ORDER — ONDANSETRON HCL 4 MG/2ML IJ SOLN
INTRAMUSCULAR | Status: DC | PRN
  Administered 2023-01-09: 4 mg via INTRAVENOUS

## 2023-01-09 MED ORDER — MIDAZOLAM HCL 2 MG/2ML IJ SOLN
INTRAMUSCULAR | Status: AC
  Filled 2023-01-09: qty 2

## 2023-01-09 MED ORDER — ROCURONIUM BROMIDE 10 MG/ML (PF) SYRINGE
PREFILLED_SYRINGE | INTRAVENOUS | Status: AC
  Filled 2023-01-09: qty 10

## 2023-01-09 MED ORDER — DEXMEDETOMIDINE HCL IN NACL 80 MCG/20ML IV SOLN
INTRAVENOUS | Status: DC | PRN
  Administered 2023-01-09: 8 ug via BUCCAL
  Administered 2023-01-09: 12 ug via BUCCAL

## 2023-01-09 MED ORDER — CEFAZOLIN SODIUM-DEXTROSE 2-4 GM/100ML-% IV SOLN
2.0000 g | Freq: Once | INTRAVENOUS | Status: AC
  Administered 2023-01-09: 2 g via INTRAVENOUS

## 2023-01-09 MED ORDER — ROCURONIUM BROMIDE 10 MG/ML (PF) SYRINGE
PREFILLED_SYRINGE | INTRAVENOUS | Status: DC | PRN
  Administered 2023-01-09: 80 mg via INTRAVENOUS

## 2023-01-09 MED ORDER — CEFAZOLIN SODIUM 1 G IJ SOLR
INTRAMUSCULAR | Status: AC
  Filled 2023-01-09: qty 20

## 2023-01-09 MED ORDER — OXYCODONE-ACETAMINOPHEN 5-325 MG PO TABS
1.0000 | ORAL_TABLET | ORAL | 0 refills | Status: AC | PRN
  Filled 2023-01-09: qty 20, 4d supply, fill #0

## 2023-01-09 MED ORDER — ONDANSETRON HCL 4 MG/2ML IJ SOLN
INTRAMUSCULAR | Status: AC
  Filled 2023-01-09: qty 2

## 2023-01-09 MED ORDER — SCOPOLAMINE 1 MG/3DAYS TD PT72
MEDICATED_PATCH | TRANSDERMAL | Status: AC
  Filled 2023-01-09: qty 1

## 2023-01-09 MED ORDER — EPHEDRINE SULFATE (PRESSORS) 50 MG/ML IJ SOLN
INTRAMUSCULAR | Status: DC | PRN
  Administered 2023-01-09 (×2): 5 mg via INTRAVENOUS

## 2023-01-09 MED ORDER — OXYCODONE HCL 5 MG/5ML PO SOLN: 5.0000 mg | Freq: Once | ORAL | Status: DC | PRN

## 2023-01-09 MED ORDER — MUPIROCIN 2 % EX OINT
TOPICAL_OINTMENT | CUTANEOUS | Status: DC | PRN
  Administered 2023-01-09: 1 via TOPICAL

## 2023-01-09 MED ORDER — MUPIROCIN 2 % EX OINT
TOPICAL_OINTMENT | CUTANEOUS | Status: AC
  Filled 2023-01-09: qty 22

## 2023-01-09 MED ORDER — LACTATED RINGERS IV SOLN: INTRAVENOUS | Status: DC

## 2023-01-09 MED ORDER — PANTOPRAZOLE SODIUM 40 MG PO TBEC
40.0000 mg | DELAYED_RELEASE_TABLET | Freq: Once | ORAL | Status: AC
  Administered 2023-01-09: 40 mg via ORAL

## 2023-01-09 MED ORDER — DEXAMETHASONE SODIUM PHOSPHATE 10 MG/ML IJ SOLN
INTRAMUSCULAR | Status: AC
  Filled 2023-01-09: qty 1

## 2023-01-09 MED ORDER — SCOPOLAMINE 1 MG/3DAYS TD PT72
1.0000 | MEDICATED_PATCH | TRANSDERMAL | Status: DC
  Administered 2023-01-09: 1.5 mg via TRANSDERMAL

## 2023-01-09 MED ORDER — OXYCODONE HCL 5 MG PO TABS: 5.0000 mg | ORAL_TABLET | Freq: Once | ORAL | Status: DC | PRN

## 2023-01-09 MED ORDER — DEXAMETHASONE SODIUM PHOSPHATE 10 MG/ML IJ SOLN
INTRAMUSCULAR | Status: DC | PRN
  Administered 2023-01-09: 10 mg via INTRAVENOUS

## 2023-01-09 MED ORDER — LIDOCAINE 2% (20 MG/ML) 5 ML SYRINGE
INTRAMUSCULAR | Status: DC | PRN
  Administered 2023-01-09: 80 mg via INTRAVENOUS

## 2023-01-09 MED ORDER — AMISULPRIDE (ANTIEMETIC) 5 MG/2ML IV SOLN
INTRAVENOUS | Status: AC
  Filled 2023-01-09: qty 4

## 2023-01-09 MED ORDER — FENTANYL CITRATE (PF) 250 MCG/5ML IJ SOLN
INTRAMUSCULAR | Status: DC | PRN
  Administered 2023-01-09 (×2): 50 ug via INTRAVENOUS

## 2023-01-09 MED ORDER — HYDROMORPHONE HCL 1 MG/ML IJ SOLN: 0.2500 mg | INTRAMUSCULAR | Status: DC | PRN

## 2023-01-09 MED ORDER — EPHEDRINE 5 MG/ML INJ
INTRAVENOUS | Status: AC
  Filled 2023-01-09: qty 5

## 2023-01-09 MED ORDER — SUGAMMADEX SODIUM 200 MG/2ML IV SOLN
INTRAVENOUS | Status: DC | PRN
  Administered 2023-01-09: 250 mg via INTRAVENOUS

## 2023-01-09 MED ORDER — AMISULPRIDE (ANTIEMETIC) 5 MG/2ML IV SOLN
10.0000 mg | Freq: Once | INTRAVENOUS | Status: AC
  Administered 2023-01-09: 10 mg via INTRAVENOUS

## 2023-01-09 MED ORDER — OXYMETAZOLINE HCL 0.05 % NA SOLN
NASAL | Status: DC | PRN
  Administered 2023-01-09: 1 via TOPICAL

## 2023-01-09 MED ORDER — FENTANYL CITRATE (PF) 100 MCG/2ML IJ SOLN
INTRAMUSCULAR | Status: AC
  Filled 2023-01-09: qty 2

## 2023-01-09 MED ORDER — AMOXICILLIN 875 MG PO TABS
875.0000 mg | ORAL_TABLET | Freq: Two times a day (BID) | ORAL | 0 refills | Status: AC
  Filled 2023-01-09: qty 6, 3d supply, fill #0

## 2023-01-09 MED ORDER — LIDOCAINE 2% (20 MG/ML) 5 ML SYRINGE
INTRAMUSCULAR | Status: AC
  Filled 2023-01-09: qty 5

## 2023-01-09 MED ORDER — PROPOFOL 10 MG/ML IV BOLUS
INTRAVENOUS | Status: DC | PRN
  Administered 2023-01-09: 200 mg via INTRAVENOUS

## 2023-01-09 MED ORDER — LIDOCAINE-EPINEPHRINE 1 %-1:100000 IJ SOLN
INTRAMUSCULAR | Status: DC | PRN
  Administered 2023-01-09: 4 mL

## 2023-01-09 MED ORDER — SUGAMMADEX SODIUM 500 MG/5ML IV SOLN
INTRAVENOUS | Status: AC
  Filled 2023-01-09: qty 5

## 2023-01-09 MED ORDER — ONDANSETRON HCL 4 MG/2ML IJ SOLN: 4.0000 mg | Freq: Once | INTRAMUSCULAR | Status: DC | PRN

## 2023-01-09 MED ORDER — MIDAZOLAM HCL 5 MG/5ML IJ SOLN
INTRAMUSCULAR | Status: DC | PRN
  Administered 2023-01-09: 2 mg via INTRAVENOUS

## 2023-01-09 MED ORDER — SODIUM CHLORIDE (PF) 0.9 % IJ SOLN
INTRAMUSCULAR | Status: AC
  Filled 2023-01-09: qty 10

## 2023-01-09 SURGICAL SUPPLY — 31 items
ATTRACTOMAT 16X20 MAGNETIC DRP (DRAPES) IMPLANT
CANISTER SUCT 1200ML W/VALVE (MISCELLANEOUS) ×2 IMPLANT
COAGULATOR SUCT 8FR VV (MISCELLANEOUS) ×2 IMPLANT
DEFOGGER MIRROR 1QT (MISCELLANEOUS) ×2 IMPLANT
DRSG NASOPORE 8CM (GAUZE/BANDAGES/DRESSINGS) IMPLANT
DRSG TELFA 3X8 NADH STRL (GAUZE/BANDAGES/DRESSINGS) IMPLANT
ELECT REM PT RETURN 9FT ADLT (ELECTROSURGICAL) ×1
ELECTRODE REM PT RTRN 9FT ADLT (ELECTROSURGICAL) ×2 IMPLANT
GAUZE SPONGE 2X2 STRL 8-PLY (GAUZE/BANDAGES/DRESSINGS) ×2 IMPLANT
GLOVE BIO SURGEON STRL SZ7.5 (GLOVE) ×2 IMPLANT
GLOVE BIOGEL PI IND STRL 7.0 (GLOVE) IMPLANT
GOWN STRL REUS W/ TWL LRG LVL3 (GOWN DISPOSABLE) ×4 IMPLANT
GOWN STRL REUS W/TWL LRG LVL3 (GOWN DISPOSABLE) ×2
NDL HYPO 25X1 1.5 SAFETY (NEEDLE) ×2 IMPLANT
NEEDLE HYPO 25X1 1.5 SAFETY (NEEDLE) ×1 IMPLANT
NS IRRIG 1000ML POUR BTL (IV SOLUTION) ×2 IMPLANT
PACK BASIN DAY SURGERY FS (CUSTOM PROCEDURE TRAY) ×2 IMPLANT
PACK ENT DAY SURGERY (CUSTOM PROCEDURE TRAY) ×2 IMPLANT
SLEEVE SCD COMPRESS KNEE MED (STOCKING) IMPLANT
SPIKE FLUID TRANSFER (MISCELLANEOUS) IMPLANT
SPLINT NASAL AIRWAY SILICONE (MISCELLANEOUS) ×2 IMPLANT
SPONGE NEURO XRAY DETECT 1X3 (DISPOSABLE) ×2 IMPLANT
SUT CHROMIC 4 0 P 3 18 (SUTURE) ×2 IMPLANT
SUT PLAIN 4 0 ~~LOC~~ 1 (SUTURE) ×2 IMPLANT
SUT PROLENE 3 0 PS 2 (SUTURE) ×2 IMPLANT
SUT VIC AB 4-0 P-3 18XBRD (SUTURE) IMPLANT
SUT VIC AB 4-0 P3 18 (SUTURE)
TOWEL GREEN STERILE FF (TOWEL DISPOSABLE) ×2 IMPLANT
TUBE SALEM SUMP 12F (TUBING) IMPLANT
TUBE SALEM SUMP 16F (TUBING) ×2 IMPLANT
YANKAUER SUCT BULB TIP NO VENT (SUCTIONS) ×2 IMPLANT

## 2023-01-09 NOTE — Discharge Instructions (Addendum)

## 2023-01-09 NOTE — Anesthesia Postprocedure Evaluation (Signed)
Anesthesia Post Note  Patient: Donald Nixon  Procedure(s) Performed: NASAL SEPTOPLASTY WITH TURBINATE REDUCTION (Bilateral: Nose)     Patient location during evaluation: PACU Anesthesia Type: General Level of consciousness: awake and alert and oriented Pain management: pain level controlled Vital Signs Assessment: post-procedure vital signs reviewed and stable Respiratory status: spontaneous breathing, nonlabored ventilation and respiratory function stable Cardiovascular status: blood pressure returned to baseline and stable Anesthetic complications: no Comments: Patient had some PON which resolved with Barhemsys.   No notable events documented.  Last Vitals:  Vitals:   01/09/23 1030 01/09/23 1045  BP: 108/71 114/72  Pulse: 90 86  Resp: 19 17  Temp: (!) 36.3 C   SpO2: 94% 93%    Last Pain:  Vitals:   01/09/23 1045  TempSrc:   PainSc: 0-No pain                 [name]

## 2023-01-09 NOTE — Op Note (Signed)
DATE OF PROCEDURE: 01/09/2023  OPERATIVE REPORT   SURGEON: Leta Baptist, MD   PREOPERATIVE DIAGNOSES:  1. Severe nasal septal deviation.  2. Bilateral inferior turbinate hypertrophy.  3. Chronic nasal obstruction.  POSTOPERATIVE DIAGNOSES:  1. Severe nasal septal deviation.  2. Bilateral inferior turbinate hypertrophy.  3. Chronic nasal obstruction.  PROCEDURE PERFORMED:  1. Septoplasty.  2. Bilateral partial inferior turbinate resection.   ANESTHESIA: General endotracheal tube anesthesia.   COMPLICATIONS: None.   ESTIMATED BLOOD LOSS: 100 mL.   INDICATION FOR PROCEDURE: Donald Nixon is a 42 y.o. male with a history of chronic nasal obstruction. The patient was treated with antihistamine, decongestant, and steroid nasal sprays. However, the patient continued to be symptomatic. On examination, the patient was noted to have bilateral severe inferior turbinate hypertrophy and significant nasal septal deviation, causing significant nasal obstruction. Based on the above findings, the decision was made for the patient to undergo the above-stated procedures. The risks, benefits, alternatives, and details of the procedures were discussed with the patient. Questions were invited and answered. Informed consent was obtained.   DESCRIPTION OF PROCEDURE: The patient was taken to the operating room and placed supine on the operating table. General endotracheal tube anesthesia was administered by the anesthesiologist. The patient was positioned, and prepped and draped in the standard fashion for nasal surgery. Pledgets soaked with Afrin were placed in both nasal cavities for decongestion. The pledgets were subsequently removed.   Examination of the nasal cavity revealed a severe nasal septal deviation. 1% lidocaine with 1:100,000 epinephrine was injected onto the nasal septum bilaterally. A hemitransfixion incision was made on the left side. The mucosal flap was carefully elevated on the left side. A  cartilaginous incision was made 1 cm superior to the caudal margin of the nasal septum. Mucosal flap was also elevated on the right side in the similar fashion. It should be noted that due to the severe septal deviation, the deviated portion of the cartilaginous and bony septum had to be removed in piecemeal fashion. Once the deviated portions were removed, a straight midline septum was achieved. The septum was then quilted with 4-0 plain gut sutures. The hemitransfixion incision was closed with interrupted 4-0 chromic sutures.   The inferior one half of both hypertrophied inferior turbinate was crossclamped with a Kelly clamp. The inferior one half of each inferior turbinate was then resected with a pair of cross cutting scissors. Hemostasis was achieved with a suction cautery device. Doyle splints were applied to the nasal septum.  The care of the patient was turned over to the anesthesiologist. The patient was awakened from anesthesia without difficulty. The patient was extubated and transferred to the recovery room in good condition.   OPERATIVE FINDINGS: Severe nasal septal deviation and bilateral inferior turbinate hypertrophy.   SPECIMEN: None.   FOLLOWUP CARE: The patient be discharged home once he is awake and alert.  The patient will follow up in my office in 3 days for splint removal.   Donald Jillson Raynelle Bring, MD

## 2023-01-09 NOTE — Anesthesia Procedure Notes (Signed)
Procedure Name: Intubation Date/Time: 01/09/2023 9:07 AM  Performed by: Glory Buff, CRNAPre-anesthesia Checklist: Patient identified, Emergency Drugs available, Suction available and Patient being monitored Patient Re-evaluated:Patient Re-evaluated prior to induction Oxygen Delivery Method: Circle system utilized Preoxygenation: Pre-oxygenation with 100% oxygen Induction Type: IV induction Ventilation: Mask ventilation without difficulty Laryngoscope Size: Miller and 3 Grade View: Grade I Tube type: Oral Tube size: 7.5 mm Number of attempts: 1 Airway Equipment and Method: Stylet and Oral airway Placement Confirmation: ETT inserted through vocal cords under direct vision, positive ETCO2 and breath sounds checked- equal and bilateral Secured at: 22 cm Tube secured with: Tape Dental Injury: Teeth and Oropharynx as per pre-operative assessment

## 2023-01-09 NOTE — H&P (Signed)
Cc: Chronic nasal obstruction  HPI: The patient is a 42 year old male who presents today complaining of chronic nasal obstruction for 3+ years.  He has a history of multiple nasal trauma, secondary to playing soccer competitively.  He also has a history of environmental allergies.  He was treated with Zyrtec and Flonase for several years without improvement in his nasal obstruction.  He has no previous ENT surgery.  Currently he denies any facial pain, fever, or visual change.  The patient's review of systems (constitutional, eyes, ENT, cardiovascular, respiratory, GI, musculoskeletal, skin, neurologic, psychiatric, endocrine, hematologic, allergic) is noted in the ROS questionnaire.  It is reviewed with the patient.  Major events: None.  Ongoing medical problems: Reflux, arthritis, allergies.  Family health history: No HTN, DM, CAD, hearing loss or bleeding disorder.  Social history: The patient is married. He denies the use of tobacco or illegal drugs. He drinks alcohol once a month.    Exam: General: Communicates without difficulty, well nourished, no acute distress. Head: Normocephalic, no evidence injury, no tenderness, facial buttresses intact without stepoff. Face/sinus: No tenderness to palpation and percussion. Facial movement is normal and symmetric. Eyes: PERRL, EOMI. No scleral icterus, conjunctivae clear. Neuro: CN II exam reveals vision grossly intact.  No nystagmus at any point of gaze. Ears: Auricles well formed without lesions.  Ear canals are intact without mass or lesion.  No erythema or edema is appreciated.  The TMs are intact without fluid. Nose: External evaluation reveals normal support and skin without lesions.  Dorsum is intact.  Anterior rhinoscopy reveals congested mucosa over anterior aspect of inferior turbinates and deviated septum.  No purulence noted. Oral:  Oral cavity and oropharynx are intact, symmetric, without erythema or edema.  Mucosa is moist without lesions.  Neck: Full range of motion without pain.  There is no significant lymphadenopathy.  No masses palpable.  Thyroid bed within normal limits to palpation.  Parotid glands and submandibular glands equal bilaterally without mass.  Trachea is midline. Neuro:  CN 2-12 grossly intact. Gait normal. A flexible scope was inserted into the right nasal cavity.  Endoscopy of the interior nasal cavity, superior, inferior, and middle meatus was performed. The sphenoid-ethmoid recess was examined. Edematous mucosa was noted.  No polyp, mass, or lesion was appreciated. Nasal septal deviation noted. Olfactory cleft was clear.  Nasopharynx was clear.  Turbinates were hypertrophied but without mass.  The procedure was repeated on the contralateral side with similar findings.  The patient tolerated the procedure well.   Assessment  1.  Chronic rhinitis with nasal mucosal congestion, nasal septal deviation, and bilateral inferior turbinate hypertrophy.  More than 95% of his nasal passageways are obstructed bilaterally. 2.  No suspicious mass, lesion, or polyp is noted today. 3.  The patient has not responded to medical treatment for the past 2+ years.  Plan  1.  The physical exam and nasal endoscopy findings are reviewed with the patient. 2.  Continue with Flonase nasal spray 2 sprays each nostril daily.  The importance of consistent daily use is discussed. 3.  In light of his persistent symptoms, he may benefit from surgical intervention with septoplasty and turbinate reduction.  The risk, benefits, and details of the procedures are discussed with the patient. 4.  The patient would like to proceed with the procedures.

## 2023-01-09 NOTE — Transfer of Care (Signed)
Immediate Anesthesia Transfer of Care Note  Patient: Donald Nixon  Procedure(s) Performed: NASAL SEPTOPLASTY WITH TURBINATE REDUCTION (Bilateral: Nose)  Patient Location: PACU  Anesthesia Type:General  Level of Consciousness: drowsy, patient cooperative, and responds to stimulation  Airway & Oxygen Therapy: Patient Spontanous Breathing and Patient connected to face mask oxygen  Post-op Assessment: Report given to RN and Post -op Vital signs reviewed and stable  Post vital signs: Reviewed and stable  Last Vitals:  Vitals Value Taken Time  BP 103/67 01/09/23 1015  Temp    Pulse 103 01/09/23 1017  Resp 18 01/09/23 1017  SpO2 93 % 01/09/23 1017  Vitals shown include unvalidated device data.  Last Pain:  Vitals:   01/09/23 0720  TempSrc: Oral  PainSc: 2       Patients Stated Pain Goal: 5 [id number] [phone]  Complications: No notable events documented.

## 2023-01-10 ENCOUNTER — Encounter (HOSPITAL_BASED_OUTPATIENT_CLINIC_OR_DEPARTMENT_OTHER): Payer: Self-pay | Admitting: Otolaryngology

## 2023-02-07 ENCOUNTER — Other Ambulatory Visit (HOSPITAL_COMMUNITY): Payer: Self-pay

## 2023-02-07 ENCOUNTER — Other Ambulatory Visit (HOSPITAL_BASED_OUTPATIENT_CLINIC_OR_DEPARTMENT_OTHER): Payer: Self-pay

## 2023-02-07 MED ORDER — AMPHETAMINE-DEXTROAMPHETAMINE 10 MG PO TABS
10.0000 mg | ORAL_TABLET | Freq: Every day | ORAL | 0 refills | Status: DC
  Filled 2023-02-07: qty 30, 30d supply, fill #0

## 2023-02-07 MED ORDER — AMPHETAMINE-DEXTROAMPHET ER 25 MG PO CP24
25.0000 mg | ORAL_CAPSULE | Freq: Every morning | ORAL | 0 refills | Status: AC
  Filled 2023-02-07: qty 30, 30d supply, fill #0

## 2023-02-07 MED ORDER — AMPHETAMINE-DEXTROAMPHET ER 10 MG PO CP24
10.0000 mg | ORAL_CAPSULE | Freq: Every morning | ORAL | 0 refills | Status: AC
  Filled 2023-02-07: qty 30, 30d supply, fill #0

## 2023-02-08 ENCOUNTER — Other Ambulatory Visit (HOSPITAL_BASED_OUTPATIENT_CLINIC_OR_DEPARTMENT_OTHER): Payer: Self-pay

## 2023-02-13 ENCOUNTER — Ambulatory Visit: Payer: BC Managed Care – PPO | Admitting: Sports Medicine

## 2023-03-13 ENCOUNTER — Other Ambulatory Visit (HOSPITAL_BASED_OUTPATIENT_CLINIC_OR_DEPARTMENT_OTHER): Payer: Self-pay

## 2023-03-13 MED ORDER — AMPHETAMINE-DEXTROAMPHET ER 10 MG PO CP24
ORAL_CAPSULE | ORAL | 0 refills | Status: DC
  Filled 2023-03-13: qty 30, 30d supply, fill #0

## 2023-03-13 MED ORDER — AMPHETAMINE-DEXTROAMPHETAMINE 10 MG PO TABS
10.0000 mg | ORAL_TABLET | Freq: Every day | ORAL | 0 refills | Status: DC
  Filled 2023-03-13: qty 30, 30d supply, fill #0

## 2023-03-13 MED ORDER — TESTOSTERONE CYPIONATE 200 MG/ML IM SOLN
INTRAMUSCULAR | 0 refills | Status: AC
  Filled 2023-03-13: qty 1, 14d supply, fill #0
  Filled 2023-04-20: qty 2, 28d supply, fill #0

## 2023-03-13 MED ORDER — AMPHETAMINE-DEXTROAMPHET ER 25 MG PO CP24
ORAL_CAPSULE | ORAL | 0 refills | Status: DC
  Filled 2023-03-13: qty 30, 30d supply, fill #0

## 2023-04-20 ENCOUNTER — Other Ambulatory Visit (HOSPITAL_BASED_OUTPATIENT_CLINIC_OR_DEPARTMENT_OTHER): Payer: Self-pay

## 2023-04-20 MED ORDER — AMPHETAMINE-DEXTROAMPHET ER 25 MG PO CP24
25.0000 mg | ORAL_CAPSULE | Freq: Every morning | ORAL | 0 refills | Status: DC
  Filled 2023-04-20: qty 30, 30d supply, fill #0

## 2023-04-20 MED ORDER — AMPHETAMINE-DEXTROAMPHETAMINE 10 MG PO TABS
10.0000 mg | ORAL_TABLET | Freq: Every day | ORAL | 0 refills | Status: DC
  Filled 2023-04-20: qty 30, 30d supply, fill #0

## 2023-04-20 MED ORDER — AMPHETAMINE-DEXTROAMPHET ER 10 MG PO CP24
10.0000 mg | ORAL_CAPSULE | Freq: Every morning | ORAL | 0 refills | Status: DC
  Filled 2023-04-20: qty 30, 30d supply, fill #0

## 2023-05-29 ENCOUNTER — Other Ambulatory Visit (HOSPITAL_BASED_OUTPATIENT_CLINIC_OR_DEPARTMENT_OTHER): Payer: Self-pay

## 2023-05-30 ENCOUNTER — Other Ambulatory Visit (HOSPITAL_BASED_OUTPATIENT_CLINIC_OR_DEPARTMENT_OTHER): Payer: Self-pay

## 2023-05-30 MED ORDER — AMPHETAMINE-DEXTROAMPHET ER 25 MG PO CP24
25.0000 mg | ORAL_CAPSULE | Freq: Every morning | ORAL | 0 refills | Status: DC
  Filled 2023-05-30: qty 30, 30d supply, fill #0

## 2023-05-30 MED ORDER — AMPHETAMINE-DEXTROAMPHET ER 10 MG PO CP24
10.0000 mg | ORAL_CAPSULE | Freq: Every morning | ORAL | 0 refills | Status: DC
  Filled 2023-05-30: qty 30, 30d supply, fill #0

## 2023-05-30 MED ORDER — AMPHETAMINE-DEXTROAMPHETAMINE 10 MG PO TABS
10.0000 mg | ORAL_TABLET | Freq: Every evening | ORAL | 0 refills | Status: DC
  Filled 2023-05-30: qty 30, 30d supply, fill #0

## 2023-07-09 ENCOUNTER — Other Ambulatory Visit (HOSPITAL_BASED_OUTPATIENT_CLINIC_OR_DEPARTMENT_OTHER): Payer: Self-pay

## 2023-07-10 ENCOUNTER — Other Ambulatory Visit (HOSPITAL_BASED_OUTPATIENT_CLINIC_OR_DEPARTMENT_OTHER): Payer: Self-pay

## 2023-07-10 MED ORDER — AMPHETAMINE-DEXTROAMPHET ER 10 MG PO CP24
10.0000 mg | ORAL_CAPSULE | Freq: Every morning | ORAL | 0 refills | Status: DC
  Filled 2023-07-10: qty 30, 30d supply, fill #0

## 2023-07-10 MED ORDER — AMPHETAMINE-DEXTROAMPHET ER 25 MG PO CP24
25.0000 mg | ORAL_CAPSULE | Freq: Every morning | ORAL | 0 refills | Status: DC
  Filled 2023-07-10: qty 30, 30d supply, fill #0

## 2023-07-10 MED ORDER — AMPHETAMINE-DEXTROAMPHETAMINE 10 MG PO TABS
10.0000 mg | ORAL_TABLET | Freq: Every evening | ORAL | 0 refills | Status: DC
  Filled 2023-07-10: qty 30, 30d supply, fill #0

## 2023-08-22 ENCOUNTER — Other Ambulatory Visit (HOSPITAL_BASED_OUTPATIENT_CLINIC_OR_DEPARTMENT_OTHER): Payer: Self-pay

## 2023-08-23 ENCOUNTER — Other Ambulatory Visit (HOSPITAL_BASED_OUTPATIENT_CLINIC_OR_DEPARTMENT_OTHER): Payer: Self-pay

## 2023-08-23 MED ORDER — AMPHETAMINE-DEXTROAMPHET ER 10 MG PO CP24
10.0000 mg | ORAL_CAPSULE | Freq: Every morning | ORAL | 0 refills | Status: DC
  Filled 2023-08-23: qty 30, 30d supply, fill #0

## 2023-08-23 MED ORDER — AMPHETAMINE-DEXTROAMPHET ER 25 MG PO CP24
25.0000 mg | ORAL_CAPSULE | Freq: Every morning | ORAL | 0 refills | Status: DC
  Filled 2023-08-23: qty 30, 30d supply, fill #0

## 2023-09-26 ENCOUNTER — Other Ambulatory Visit (HOSPITAL_BASED_OUTPATIENT_CLINIC_OR_DEPARTMENT_OTHER): Payer: Self-pay

## 2023-09-26 MED ORDER — AMPHETAMINE-DEXTROAMPHET ER 25 MG PO CP24
25.0000 mg | ORAL_CAPSULE | Freq: Every morning | ORAL | 0 refills | Status: DC
  Filled 2023-09-26: qty 30, 30d supply, fill #0

## 2023-09-26 MED ORDER — AMPHETAMINE-DEXTROAMPHET ER 10 MG PO CP24
10.0000 mg | ORAL_CAPSULE | Freq: Every morning | ORAL | 0 refills | Status: DC
  Filled 2023-09-26: qty 30, 30d supply, fill #0

## 2023-11-02 ENCOUNTER — Other Ambulatory Visit (HOSPITAL_BASED_OUTPATIENT_CLINIC_OR_DEPARTMENT_OTHER): Payer: Self-pay

## 2023-11-03 ENCOUNTER — Other Ambulatory Visit (HOSPITAL_BASED_OUTPATIENT_CLINIC_OR_DEPARTMENT_OTHER): Payer: Self-pay

## 2023-11-03 MED ORDER — AMPHETAMINE-DEXTROAMPHET ER 10 MG PO CP24
10.0000 mg | ORAL_CAPSULE | Freq: Every morning | ORAL | 0 refills | Status: DC
  Filled 2023-11-03: qty 30, 30d supply, fill #0

## 2023-11-03 MED ORDER — AMPHETAMINE-DEXTROAMPHET ER 25 MG PO CP24
25.0000 mg | ORAL_CAPSULE | Freq: Every morning | ORAL | 0 refills | Status: DC
  Filled 2023-11-03: qty 30, 30d supply, fill #0
